# Patient Record
Sex: Female | Born: 1935 | Race: White | Hispanic: No | Smoking: Never smoker
Health system: Southern US, Community
[De-identification: ages and names within clinical notes are randomized; demographics above are authoritative.]

## PROBLEM LIST (undated history)

## (undated) DIAGNOSIS — I4891 Unspecified atrial fibrillation: Secondary | ICD-10-CM

## (undated) DIAGNOSIS — Z950 Presence of cardiac pacemaker: Secondary | ICD-10-CM

## (undated) HISTORY — PX: JOINT REPLACEMENT: SHX530

## (undated) HISTORY — PX: MITRAL VALVE REPLACEMENT: SHX147

## (undated) HISTORY — PX: OTHER SURGICAL HISTORY: SHX169

## (undated) HISTORY — DX: Presence of cardiac pacemaker: Z95.0

## (undated) HISTORY — PX: MASTECTOMY: SHX3

## (undated) HISTORY — DX: Unspecified atrial fibrillation: I48.91

---

## 2020-06-04 ENCOUNTER — Emergency Department (HOSPITAL_BASED_OUTPATIENT_CLINIC_OR_DEPARTMENT_OTHER): Payer: Medicare Other

## 2020-06-04 ENCOUNTER — Emergency Department (HOSPITAL_BASED_OUTPATIENT_CLINIC_OR_DEPARTMENT_OTHER)
Admission: EM | Admit: 2020-06-04 | Discharge: 2020-06-04 | Disposition: A | Discharge status: Home / Self Care | Payer: Medicare Other | Attending: Emergency Medicine | Admitting: Emergency Medicine

## 2020-06-04 ENCOUNTER — Encounter (HOSPITAL_BASED_OUTPATIENT_CLINIC_OR_DEPARTMENT_OTHER): Payer: Self-pay | Admitting: *Deleted

## 2020-06-04 ENCOUNTER — Other Ambulatory Visit: Payer: Self-pay

## 2020-06-04 DIAGNOSIS — W228XXA Striking against or struck by other objects, initial encounter: Secondary | ICD-10-CM | POA: Diagnosis not present

## 2020-06-04 DIAGNOSIS — M25522 Pain in left elbow: Secondary | ICD-10-CM | POA: Diagnosis not present

## 2020-06-04 DIAGNOSIS — W19XXXA Unspecified fall, initial encounter: Secondary | ICD-10-CM

## 2020-06-04 DIAGNOSIS — S0003XA Contusion of scalp, initial encounter: Secondary | ICD-10-CM | POA: Insufficient documentation

## 2020-06-04 DIAGNOSIS — M542 Cervicalgia: Secondary | ICD-10-CM | POA: Diagnosis not present

## 2020-06-04 DIAGNOSIS — Y92000 Kitchen of unspecified non-institutional (private) residence as  the place of occurrence of the external cause: Secondary | ICD-10-CM | POA: Diagnosis not present

## 2020-06-04 DIAGNOSIS — S0990XA Unspecified injury of head, initial encounter: Secondary | ICD-10-CM | POA: Diagnosis present

## 2020-06-04 HISTORY — DX: Unspecified atrial fibrillation: I48.91

## 2020-06-04 HISTORY — DX: Presence of cardiac pacemaker: Z95.0

## 2020-06-04 NOTE — ED Triage Notes (Signed)
Presents with left elbow and left shoulder pain from a fall yesterday, fell in kitchen, did not have chest pain, HA or dizziness prior to fall, states she "slipped and fell". States she takes Eliquis and did hit head above left eye, slight bruising noted. No LOC per pt statement

## 2020-06-04 NOTE — ED Notes (Signed)
ED Provider at bedside. 

## 2020-06-04 NOTE — ED Notes (Signed)
Family at bedside, sr x 2 up, call bell within reach, Fall Risk Bracelet in place

## 2020-06-04 NOTE — ED Provider Notes (Signed)
[facility] EMERGENCY DEPARTMENT Provider Note   CSN: [id number] Arrival date & time: 06/04/20  1633     History CC: Fall head injury  Stavroula Rohde is a 86 y.o. female with a history of A. fib on Eliquis presenting emergency department with a mechanical fall.  Patient reports that she lost her footing or misplaced her hand yesterday evening, and fell in the kitchen.  She reports she struck her left forehead on a counter and also think she struck her left elbow on a counter or the ground.  There is no loss of consciousness.  She had a headache after the fall but it is gradually resolved.  She reports significant pain in her left elbow just distal to her forearm.  She reports in the past she has had a fracture of this forearm near the similar site which had to be surgically repaired by an orthopedist.  She denies numbness in her hand.  She has been taken Tylenol at home for pain, as well as once a day tramadol, but has not helped with her arm pain.  She also reporting some pain in her left neck.  She denies radiculopathy.  She reports a distant history of a C2 fracture, which was not surgically repaired, but for which she wore a neck brace for weeks.   HPI     Past Medical History:  Diagnosis Date  . Atrial fibrillation (HCC)   . Pacemaker     There are no problems to display for this patient.    OB History   No obstetric history on file.     No family history on file.  Social History   Tobacco Use  . Smoking status: Never Smoker  . Smokeless tobacco: Never Used  Substance Use Topics  . Alcohol use: Not Currently  . Drug use: Never    Home Medications Prior to Admission medications   Not on File    Allergies    Patient has no allergy information on record.  Review of Systems   Review of Systems  Constitutional: Negative for chills and fever.  HENT: Negative for ear pain and sore throat.   Eyes: Negative for pain and visual disturbance.  Respiratory:  Negative for cough and shortness of breath.   Cardiovascular: Negative for chest pain and palpitations.  Gastrointestinal: Negative for abdominal pain and vomiting.  Genitourinary: Negative for dysuria and hematuria.  Musculoskeletal: Positive for arthralgias, myalgias and neck pain.  Skin: Negative for color change and rash.  Neurological: Positive for light-headedness and headaches. Negative for seizures and syncope.  Psychiatric/Behavioral: Negative for agitation and confusion.  All other systems reviewed and are negative.   Physical Exam Updated Vital Signs BP (!) 148/86 (BP Location: Right Arm)   Pulse 75   Temp 97.7 F (36.5 C) (Oral)   Resp 18   Ht 5\' 8"  (1.727 m)   Wt 68.5 kg   SpO2 100%   BMI 22.96 kg/m   Physical Exam Vitals and nursing note reviewed.  Constitutional:      General: She is not in acute distress.    Appearance: She is well-developed.  HENT:     Head: Normocephalic.     Comments: Contusion above left eye Eyes:     Conjunctiva/sclera: Conjunctivae normal.     Pupils: Pupils are equal, round, and reactive to light.  Neck:     Comments: Left paraspinal neck ttp Cardiovascular:     Rate and Rhythm: Normal rate and regular rhythm.  Heart sounds: No murmur heard.   Pulmonary:     Effort: Pulmonary effort is normal. No respiratory distress.     Breath sounds: Normal breath sounds.  Abdominal:     General: There is no distension.     Palpations: Abdomen is soft.     Tenderness: There is no abdominal tenderness.  Musculoskeletal:     Comments: LUE: Full ROM at shoulder, pain with full extension at the elbow.  TTP of the distal elbow near olecranon.  No visible deformity. Left wrist non-tender. No snuffbox tenderness. No pain with axial loading of L thumb. No superficial tenderness (skin) of L palm. No tenderness of dorsal metacarpal bones. Intact opposition, finger abduction, and wrist flexion / extension. Remainder of extremity exams normal.   No pelvic ttp or pain.   Skin:    General: Skin is warm and dry.  Neurological:     General: No focal deficit present.     Mental Status: She is alert and oriented to person, place, and time.     Sensory: No sensory deficit.     ED Results / Procedures / Treatments   Labs (all labs ordered are listed, but only abnormal results are displayed) Labs Reviewed - No data to display  EKG None  Radiology DG Elbow Complete Left  Result Date: 06/04/2020 CLINICAL DATA:  Fall, left elbow tenderness to palpation EXAM: LEFT ELBOW - COMPLETE 3+ VIEW; LEFT HUMERUS - 2+ VIEW; LEFT FOREARM - 2 VIEW COMPARISON:  None. FINDINGS: Osteopenia. No displaced acute fracture or dislocation of the left humerus, elbow, or forearm. There is pin and cerclage fixation of the left olecranon. The distal radius and ulna are intact. Soft tissues are unremarkable. IMPRESSION: 1. No displaced acute fracture or dislocation of the left humerus, elbow, or forearm. 2. Pin and cerclage fixation of the left olecranon. Electronically Signed   By: Lauralyn PrimesAlex  Bibbey M.D.   On: 06/04/2020 17:56   DG Forearm Left  Result Date: 06/04/2020 CLINICAL DATA:  Fall, left elbow tenderness to palpation EXAM: LEFT ELBOW - COMPLETE 3+ VIEW; LEFT HUMERUS - 2+ VIEW; LEFT FOREARM - 2 VIEW COMPARISON:  None. FINDINGS: Osteopenia. No displaced acute fracture or dislocation of the left humerus, elbow, or forearm. There is pin and cerclage fixation of the left olecranon. The distal radius and ulna are intact. Soft tissues are unremarkable. IMPRESSION: 1. No displaced acute fracture or dislocation of the left humerus, elbow, or forearm. 2. Pin and cerclage fixation of the left olecranon. Electronically Signed   By: Lauralyn PrimesAlex  Bibbey M.D.   On: 06/04/2020 17:56   CT Head Wo Contrast  Result Date: 06/04/2020 CLINICAL DATA:  Trauma/fall, on Eliquis, bruising to left eye. Chronic C2 fracture, by report. EXAM: CT HEAD WITHOUT CONTRAST CT CERVICAL SPINE WITHOUT  CONTRAST TECHNIQUE: Multidetector CT imaging of the head and cervical spine was performed following the standard protocol without intravenous contrast. Multiplanar CT image reconstructions of the cervical spine were also generated. COMPARISON:  CT head dated 03/15/2020 FINDINGS: CT HEAD FINDINGS Brain: No evidence of acute infarction, hemorrhage, hydrocephalus, extra-axial collection or mass lesion/mass effect. Global cortical atrophy. Subcortical white matter and periventricular small vessel ischemic changes. Vascular: Intracranial atherosclerosis. Skull: Normal. Negative for fracture or focal lesion. Sinuses/Orbits: The visualized paranasal sinuses are essentially clear. The mastoid air cells are unopacified. Other: None. CT CERVICAL SPINE FINDINGS Alignment: Normal cervical lordosis. Skull base and vertebrae: No acute fracture. Suspected residual deformity related to a healed type 2/3 dens fracture (sagittal image  31). No primary bone lesion or focal pathologic process. Soft tissues and spinal canal: No prevertebral fluid or swelling. No visible canal hematoma. Disc levels: Very mild degenerative changes at C5-6. Spinal canal is patent. Upper chest: Visualized lung apices are notable for mild biapical pleural-parenchymal scarring. Other: Visualized thyroid is unremarkable. IMPRESSION: No evidence of acute intracranial abnormality. Atrophy with small vessel ischemic changes. No evidence of acute traumatic injury to the cervical spine. Suspected residual deformity related to a healed type 2/3 dens fracture. Electronically Signed   By: Charline Bills M.D.   On: 06/04/2020 17:34   CT Cervical Spine Wo Contrast  Result Date: 06/04/2020 CLINICAL DATA:  Trauma/fall, on Eliquis, bruising to left eye. Chronic C2 fracture, by report. EXAM: CT HEAD WITHOUT CONTRAST CT CERVICAL SPINE WITHOUT CONTRAST TECHNIQUE: Multidetector CT imaging of the head and cervical spine was performed following the standard protocol  without intravenous contrast. Multiplanar CT image reconstructions of the cervical spine were also generated. COMPARISON:  CT head dated 03/15/2020 FINDINGS: CT HEAD FINDINGS Brain: No evidence of acute infarction, hemorrhage, hydrocephalus, extra-axial collection or mass lesion/mass effect. Global cortical atrophy. Subcortical white matter and periventricular small vessel ischemic changes. Vascular: Intracranial atherosclerosis. Skull: Normal. Negative for fracture or focal lesion. Sinuses/Orbits: The visualized paranasal sinuses are essentially clear. The mastoid air cells are unopacified. Other: None. CT CERVICAL SPINE FINDINGS Alignment: Normal cervical lordosis. Skull base and vertebrae: No acute fracture. Suspected residual deformity related to a healed type 2/3 dens fracture (sagittal image 31). No primary bone lesion or focal pathologic process. Soft tissues and spinal canal: No prevertebral fluid or swelling. No visible canal hematoma. Disc levels: Very mild degenerative changes at C5-6. Spinal canal is patent. Upper chest: Visualized lung apices are notable for mild biapical pleural-parenchymal scarring. Other: Visualized thyroid is unremarkable. IMPRESSION: No evidence of acute intracranial abnormality. Atrophy with small vessel ischemic changes. No evidence of acute traumatic injury to the cervical spine. Suspected residual deformity related to a healed type 2/3 dens fracture. Electronically Signed   By: Charline Bills M.D.   On: 06/04/2020 17:34   DG Humerus Left  Result Date: 06/04/2020 CLINICAL DATA:  Fall, left elbow tenderness to palpation EXAM: LEFT ELBOW - COMPLETE 3+ VIEW; LEFT HUMERUS - 2+ VIEW; LEFT FOREARM - 2 VIEW COMPARISON:  None. FINDINGS: Osteopenia. No displaced acute fracture or dislocation of the left humerus, elbow, or forearm. There is pin and cerclage fixation of the left olecranon. The distal radius and ulna are intact. Soft tissues are unremarkable. IMPRESSION: 1. No  displaced acute fracture or dislocation of the left humerus, elbow, or forearm. 2. Pin and cerclage fixation of the left olecranon. Electronically Signed   By: Lauralyn Primes M.D.   On: 06/04/2020 17:56    Procedures Procedures (including critical care time)  Medications Ordered in ED Medications - No data to display  ED Course  I have reviewed the triage vital signs and the nursing notes.  Pertinent labs & imaging results that were available during my care of the patient were reviewed by me and considered in my medical decision making (see chart for details).  This is a 86 year old female on Eliquis presented emergency department with a mechanical fall yesterday, here with a contusion to her head as well as left elbow pain.  Plan for CT scan of the head and the C-spine.  We will also get x-rays of the left forearm, left elbow, left humerus.  She is neurovascularly intact.  She has no other  evidence of injuries anywhere else on her exam.  She is here with her family member.  Clinical Course as of Jun 06 39  Sat Jun 04, 2020  1746  IMPRESSION: No evidence of acute intracranial abnormality. Atrophy with small vessel ischemic changes.  No evidence of acute traumatic injury to the cervical spine. Suspected residual deformity related to a healed type 2/3 dens fracture.    [MT]  1804 IMPRESSION: 1. No displaced acute fracture or dislocation of the left humerus, elbow, or forearm. 2. Pin and cerclage fixation of the left olecranon   [MT]  1811 Reviewed the patient's findings with her.  No acute fracture seen.  I recommend that she follow-up with an orthopedist.  She no longer sees her elbow specialist who is under her arm, but will rather see someone locally.  I will provide the on-call providers number here.  I did offer her a sling for her comfort, but she and her daughter refuses, stating that she needs to use both arms to ambulate with a stroller.  I do think she is safe to do this.   Okay for discharge.   [MT]    Clinical Course User Index [MT] Renaye Rakers Kermit Balo, MD    Final Clinical Impression(s) / ED Diagnoses Final diagnoses:  Left elbow pain  Fall, initial encounter  Contusion of scalp, initial encounter    Rx / DC Orders ED Discharge Orders    None       Teagan Ozawa, Kermit Balo, MD 06/05/20 0040

## 2020-06-06 ENCOUNTER — Telehealth: Payer: Self-pay | Admitting: Family Medicine

## 2020-06-06 NOTE — Telephone Encounter (Signed)
ED follow appt declined --Patient states will have PCP advice before scheduling w/ [place].  -- FYI.  --glh

## 2020-06-13 ENCOUNTER — Telehealth: Payer: Self-pay | Admitting: *Deleted

## 2020-06-13 ENCOUNTER — Ambulatory Visit (INDEPENDENT_AMBULATORY_CARE_PROVIDER_SITE_OTHER): Payer: Medicare Other | Admitting: Family Medicine

## 2020-06-13 ENCOUNTER — Ambulatory Visit: Payer: Self-pay

## 2020-06-13 ENCOUNTER — Encounter: Payer: Self-pay | Admitting: Family Medicine

## 2020-06-13 ENCOUNTER — Other Ambulatory Visit: Payer: Self-pay

## 2020-06-13 VITALS — BP 124/71 | HR 70 | Ht 69.0 in | Wt 151.0 lb

## 2020-06-13 DIAGNOSIS — M25422 Effusion, left elbow: Secondary | ICD-10-CM

## 2020-06-13 DIAGNOSIS — M25522 Pain in left elbow: Secondary | ICD-10-CM

## 2020-06-13 MED ORDER — HYDROCODONE-ACETAMINOPHEN 5-325 MG PO TABS: 1.0000 | ORAL_TABLET | Freq: Three times a day (TID) | ORAL | 0 refills | Status: AC | PRN

## 2020-06-13 NOTE — Telephone Encounter (Signed)
Pt called regarding name of orthopedic MD she was referred to on her last ED visit.  RNCM reviewed chart and provided Dr Jordan Likes number as printed on AVS.

## 2020-06-13 NOTE — Progress Notes (Signed)
Rebecca Kerr - 86 y.o. female MRN [id number]  Date of birth: 03-Aug-1925  SUBJECTIVE:  Including CC & ROS.  Chief Complaint  Patient presents with  . Elbow Injury    left x 10 days    Rebecca Kerr is a 86 y.o. female that is presenting with left elbow pain.  She had a fall about 10 days ago.  Since that time she said severe left elbow pain.  She has had bruising around the elbow.  She has severe pain with any movement.  She cannot extend her arm.  Denies any numbness or tingling.  Has a history of surgery of the elbow..  Independent review of the left elbow x-ray from 10/2 shows a positive sail sign.   Review of Systems See HPI   HISTORY: Past Medical, Surgical, Social, and Family History Reviewed & Updated per EMR.   Pertinent Historical Findings include:  Past Medical History:  Diagnosis Date  . Atrial fibrillation (HCC)   . Pacemaker     Past Surgical History:  Procedure Laterality Date  . diverticulitis    . JOINT REPLACEMENT    . MASTECTOMY    . MITRAL VALVE REPLACEMENT      History reviewed. No pertinent family history.  Social History   Socioeconomic History  . Marital status: Divorced    Spouse name: Not on file  . Number of children: Not on file  . Years of education: Not on file  . Highest education level: Not on file  Occupational History  . Not on file  Tobacco Use  . Smoking status: Never Smoker  . Smokeless tobacco: Never Used  Substance and Sexual Activity  . Alcohol use: Not Currently  . Drug use: Never  . Sexual activity: Not Currently  Other Topics Concern  . Not on file  Social History Narrative  . Not on file   Social Determinants of Health   Financial Resource Strain:   . Difficulty of Paying Living Expenses: Not on file  Food Insecurity:   . Worried About Programme researcher, broadcasting/film/video in the Last Year: Not on file  . Ran Out of Food in the Last Year: Not on file  Transportation Needs:   . Lack of Transportation (Medical): Not on file  .  Lack of Transportation (Non-Medical): Not on file  Physical Activity:   . Days of Exercise per Week: Not on file  . Minutes of Exercise per Session: Not on file  Stress:   . Feeling of Stress : Not on file  Social Connections:   . Frequency of Communication with Friends and Family: Not on file  . Frequency of Social Gatherings with Friends and Family: Not on file  . Attends Religious Services: Not on file  . Active Member of Clubs or Organizations: Not on file  . Attends Banker Meetings: Not on file  . Marital Status: Not on file  Intimate Partner Violence:   . Fear of Current or Ex-Partner: Not on file  . Emotionally Abused: Not on file  . Physically Abused: Not on file  . Sexually Abused: Not on file     PHYSICAL EXAM:  VS: BP 124/71   Pulse 70   Ht 5\' 9"  (1.753 m)   Wt 151 lb (68.5 kg)   BMI 22.30 kg/m  Physical Exam Gen: NAD, alert, cooperative with exam, well-appearing MSK:  Left elbow: Limited flexion and extension. Ecchymosis over the distal radius. Able to supinate and pronate. Normal grip strength. Pain with  palpation around the proximal radius.  Limited ultrasound: Left elbow:  Moderate effusion within the elbow joint. No changes at the lateral epicondyle. No changes of the olecranon.   Summary: Elbow effusion.  Ultrasound and interpretation by Clare Gandy, MD  1. Elbow,arm 2. Left 3. Posterior long arm splint 4. Ortho-Glass 5. Applied by Dr. Jordan Likes   ASSESSMENT & PLAN:   Elbow effusion, left She had a fall on 10/2.  Has had ongoing pain and lack of range of motion.  Imaging suggest occult fracture with positive sail sign.  Moderate effusion on ultrasound today. -Counseled supportive care. -Placed splint and sling. -Norco. -Follow-up in 1 week.

## 2020-06-13 NOTE — Patient Instructions (Signed)
Nice to meet you Please try ice  Please use the noco as needed. Do not take with tramadol   Please send me a message in MyChart with any questions or updates.  Please see me back in 1 week.   --Dr. Jordan Likes

## 2020-06-14 ENCOUNTER — Encounter: Payer: Self-pay | Admitting: Family Medicine

## 2020-06-14 DIAGNOSIS — M25422 Effusion, left elbow: Secondary | ICD-10-CM | POA: Insufficient documentation

## 2020-06-14 NOTE — Assessment & Plan Note (Signed)
She had a fall on 10/2.  Has had ongoing pain and lack of range of motion.  Imaging suggest occult fracture with positive sail sign.  Moderate effusion on ultrasound today. -Counseled supportive care. -Placed splint and sling. -Norco. -Follow-up in 1 week.

## 2020-06-20 ENCOUNTER — Ambulatory Visit: Payer: Medicare Other | Admitting: Family Medicine

## 2020-06-20 NOTE — Progress Notes (Deleted)
  Connelly Spruell - 86 y.o. female MRN [id number]  Date of birth: [birth year]  SUBJECTIVE:  Including CC & ROS.  No chief complaint on file.   Khaniyah Bezek is a 86 y.o. female that is  ***.  ***   Review of Systems See HPI   HISTORY: Past Medical, Surgical, Social, and Family History Reviewed & Updated per EMR.   Pertinent Historical Findings include:  Past Medical History:  Diagnosis Date  . Atrial fibrillation (HCC)   . Pacemaker     Past Surgical History:  Procedure Laterality Date  . diverticulitis    . JOINT REPLACEMENT    . MASTECTOMY    . MITRAL VALVE REPLACEMENT      No family history on file.  Social History   Socioeconomic History  . Marital status: Divorced    Spouse name: Not on file  . Number of children: Not on file  . Years of education: Not on file  . Highest education level: Not on file  Occupational History  . Not on file  Tobacco Use  . Smoking status: Never Smoker  . Smokeless tobacco: Never Used  Substance and Sexual Activity  . Alcohol use: Not Currently  . Drug use: Never  . Sexual activity: Not Currently  Other Topics Concern  . Not on file  Social History Narrative  . Not on file   Social Determinants of Health   Financial Resource Strain:   . Difficulty of Paying Living Expenses: Not on file  Food Insecurity:   . Worried About Programme researcher, broadcasting/film/video in the Last Year: Not on file  . Ran Out of Food in the Last Year: Not on file  Transportation Needs:   . Lack of Transportation (Medical): Not on file  . Lack of Transportation (Non-Medical): Not on file  Physical Activity:   . Days of Exercise per Week: Not on file  . Minutes of Exercise per Session: Not on file  Stress:   . Feeling of Stress : Not on file  Social Connections:   . Frequency of Communication with Friends and Family: Not on file  . Frequency of Social Gatherings with Friends and Family: Not on file  . Attends Religious Services: Not on file  . Active Member of  Clubs or Organizations: Not on file  . Attends Banker Meetings: Not on file  . Marital Status: Not on file  Intimate Partner Violence:   . Fear of Current or Ex-Partner: Not on file  . Emotionally Abused: Not on file  . Physically Abused: Not on file  . Sexually Abused: Not on file     PHYSICAL EXAM:  VS: There were no vitals taken for this visit. Physical Exam Gen: NAD, alert, cooperative with exam, well-appearing MSK:  ***      ASSESSMENT & PLAN:   No problem-specific Assessment & Plan notes found for this encounter.

## 2021-06-03 IMAGING — DX DG FOREARM 2V*L*
2 series · 2 of 2 positions shown · non-contrast
Comparison: None.

CLINICAL DATA: Fall, left elbow tenderness to palpation

EXAM:
LEFT ELBOW - COMPLETE 3+ VIEW; LEFT HUMERUS - 2+ VIEW; LEFT FOREARM
- 2 VIEW

[forearm ap]
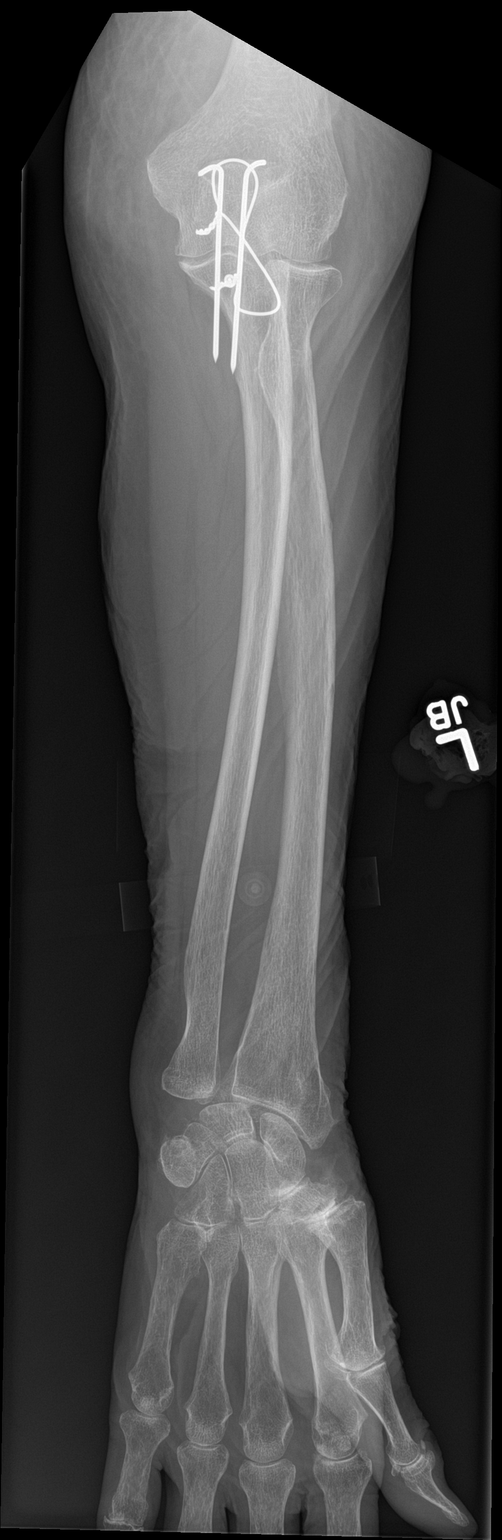

[forearm lat]
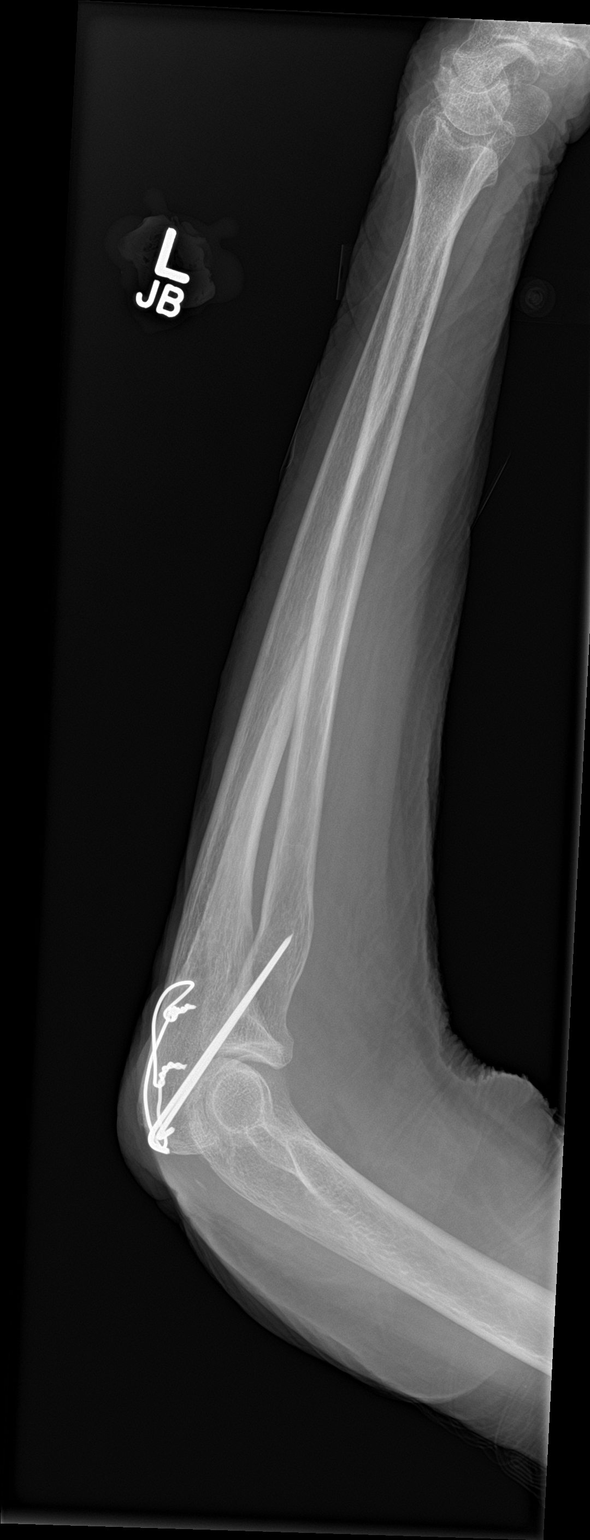

[2 of 2 positions shown; findings below may reference images not displayed]

FINDINGS: Osteopenia. No displaced acute fracture or dislocation of the left
humerus, elbow, or forearm. There is pin and cerclage fixation of
the left olecranon. The distal radius and ulna are intact. Soft
tissues are unremarkable.
IMPRESSION: 1. No displaced acute fracture or dislocation of the left humerus,
elbow, or forearm.
2. Pin and cerclage fixation of the left olecranon.

## 2021-06-03 IMAGING — CT CT CERVICAL SPINE W/O CM
2 series · 12 of 27 positions shown, 15 images · non-contrast
Comparison: CT head dated 03/15/2020

CLINICAL DATA: Trauma/fall, on Eliquis, bruising to left eye.
Chronic C2 fracture, by report.

EXAM:
CT HEAD WITHOUT CONTRAST
CT CERVICAL SPINE WITHOUT CONTRAST
TECHNIQUE: Multidetector CT imaging of the head and cervical spine was
performed following the standard protocol without intravenous
contrast. Multiplanar CT image reconstructions of the cervical spine
were also generated.

[Series 3: c_spine 2.0 i30s 3 · axial · 0.40mm/px · z∈[-308,-176]mm · 7 of 78 slices shown, 9 images]
[im 6/78  soft-tissue]
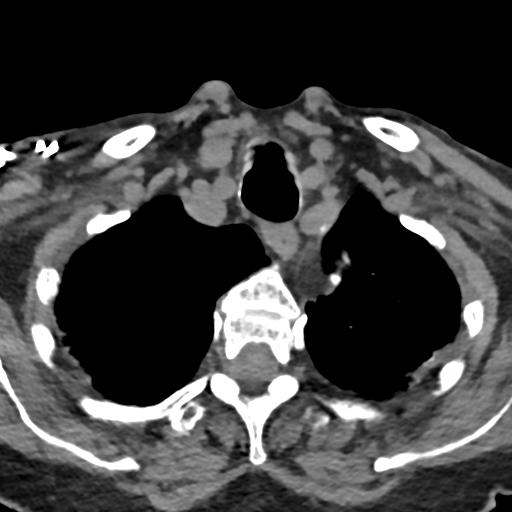
[im 6/78  bone]
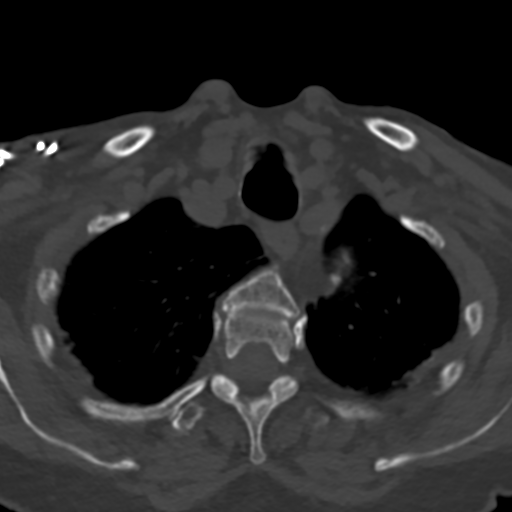
[im 18/78  bone]
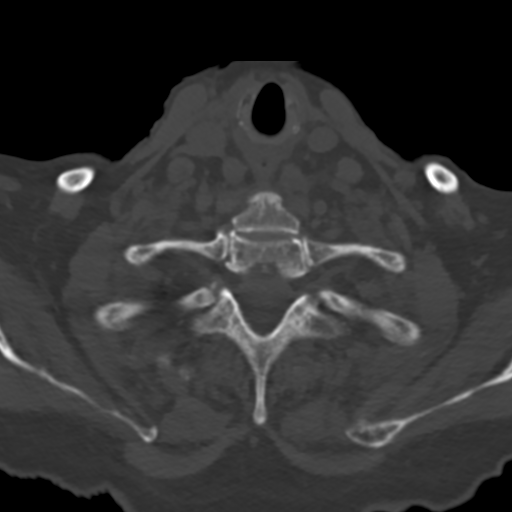
[im 30/78  bone]
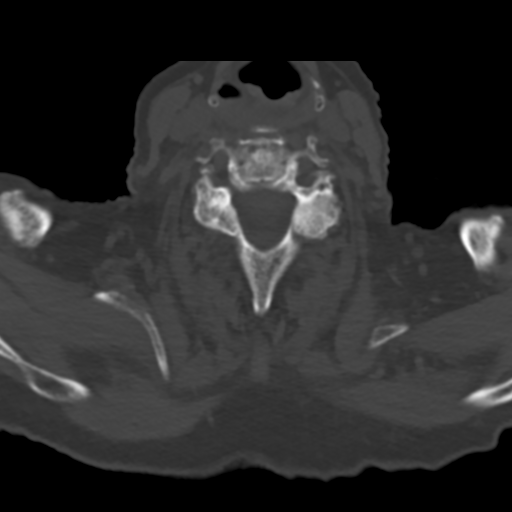
[im 42/78  bone]
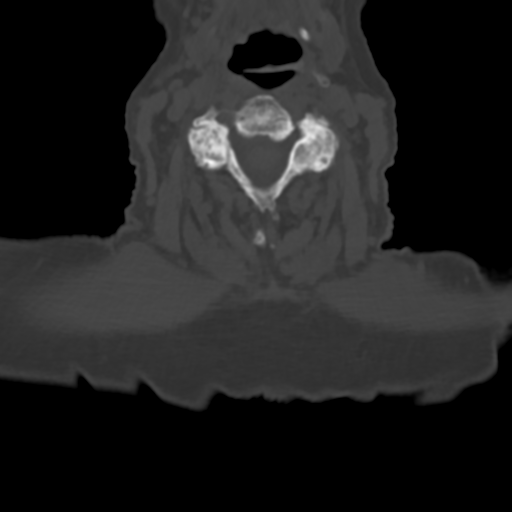
[im 48/78  soft-tissue]
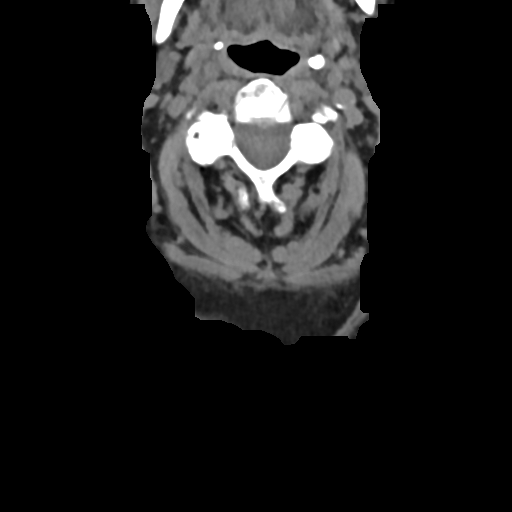
[im 48/78  bone]
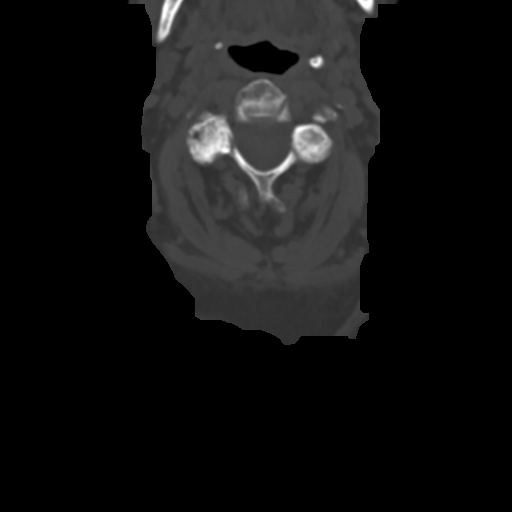
[im 60/78  bone]
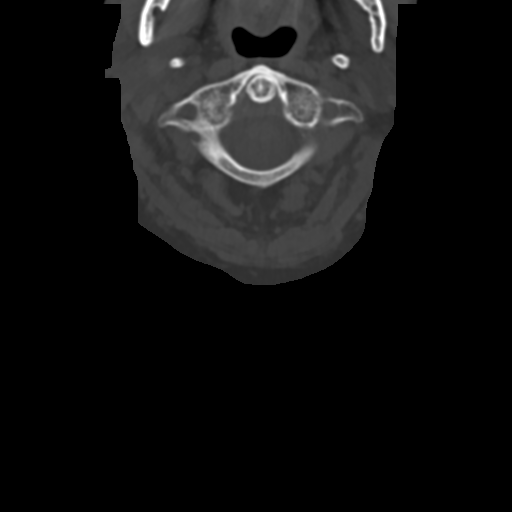
[im 72/78  bone]
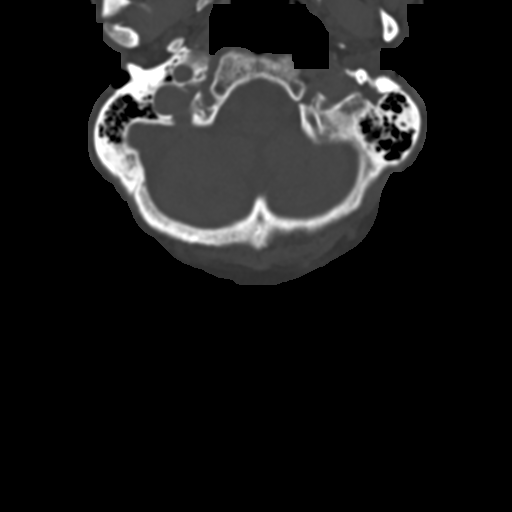

[Series 6: sagittals · sagittal · 0.28mm/px · 5 of 63 slices shown, 6 images]
[im 21/63  bone]
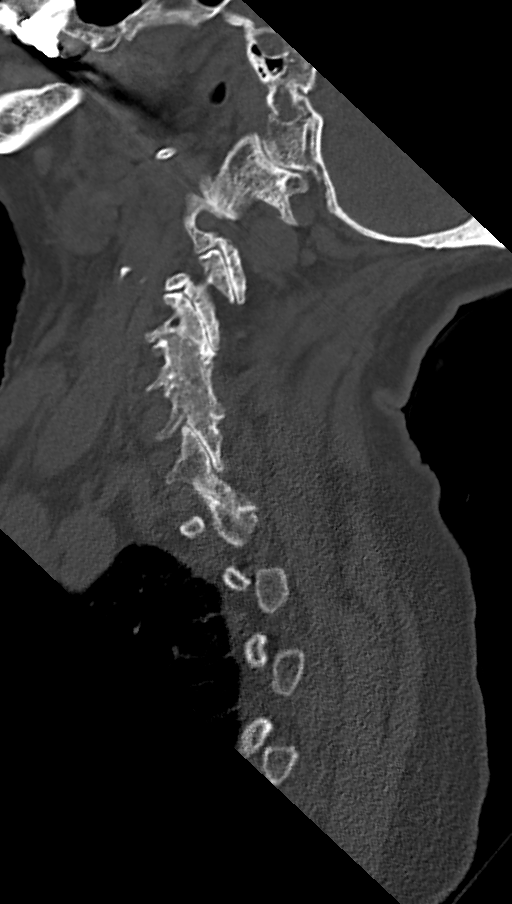
[im 26/63  bone]
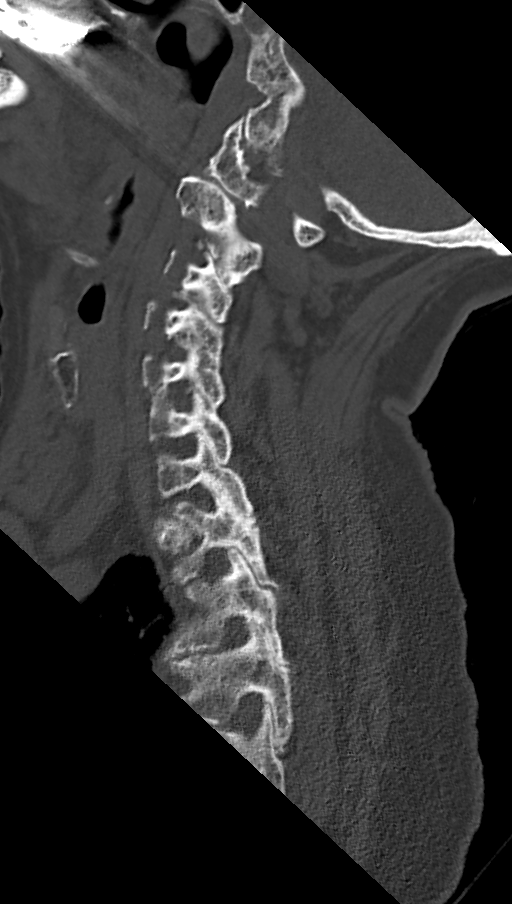
[im 32/63  soft-tissue]
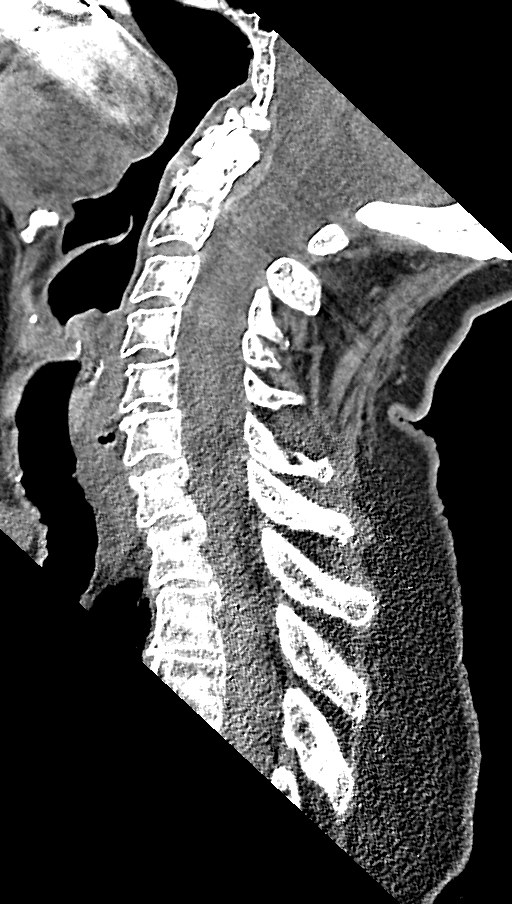
[im 32/63  bone]
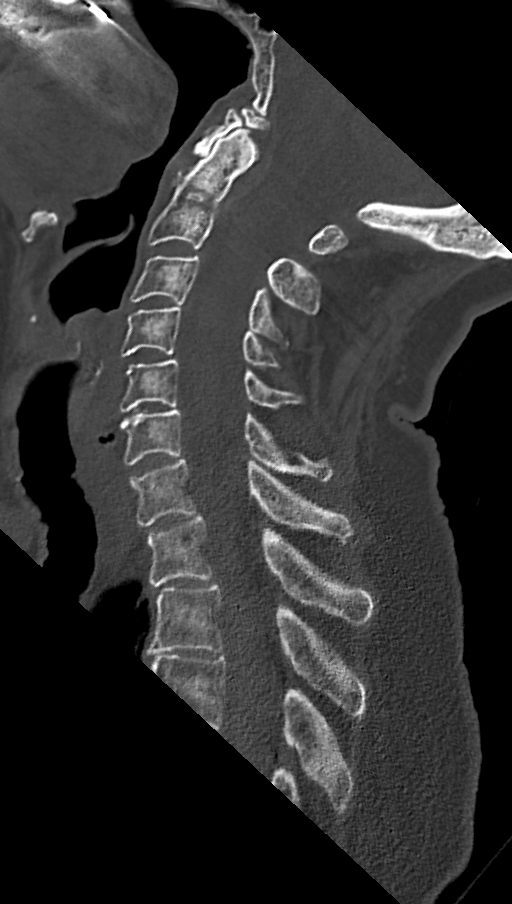
[im 37/63  bone]
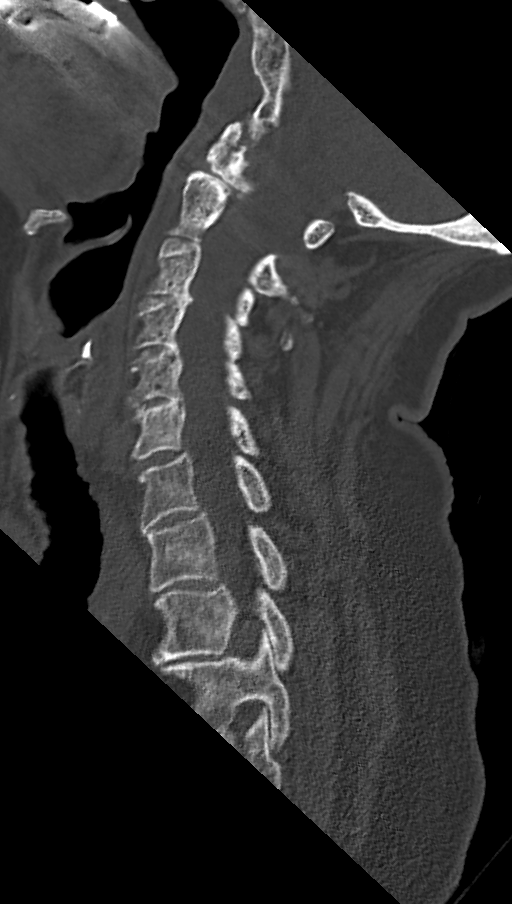
[im 42/63  bone]
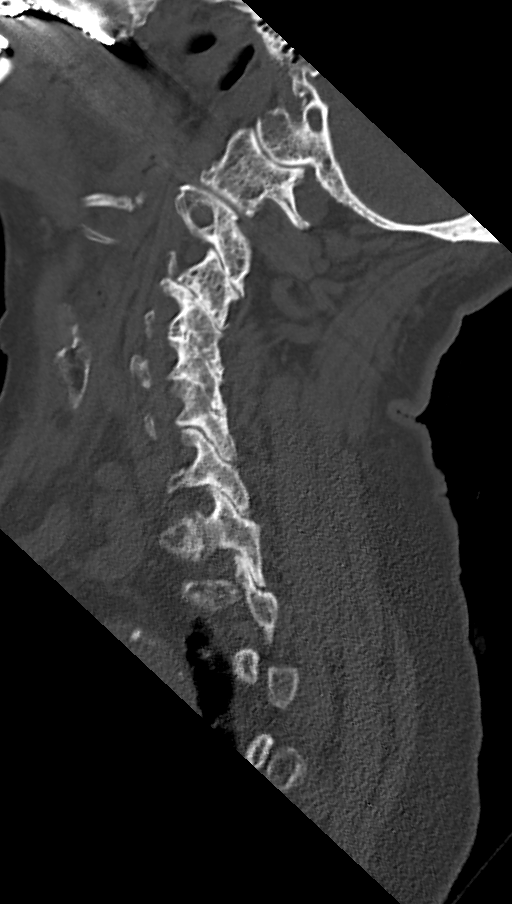

[12 of 27 positions shown; findings below may reference images not displayed]

FINDINGS: CT HEAD FINDINGS

Brain: No evidence of acute infarction, hemorrhage, hydrocephalus,
extra-axial collection or mass lesion/mass effect.

Global cortical atrophy.

Subcortical white matter and periventricular small vessel ischemic
changes.

Vascular: Intracranial atherosclerosis.

Skull: Normal. Negative for fracture or focal lesion.

Sinuses/Orbits: The visualized paranasal sinuses are essentially
clear. The mastoid air cells are unopacified.

Other: None.

CT CERVICAL SPINE FINDINGS

Alignment: Normal cervical lordosis.

Skull base and vertebrae: No acute fracture. Suspected residual
deformity related to a healed type [DATE] dens fracture (sagittal image
31). No primary bone lesion or focal pathologic process.

Soft tissues and spinal canal: No prevertebral fluid or swelling. No
visible canal hematoma.

Disc levels: Very mild degenerative changes at C5-6. Spinal canal is
patent.

Upper chest: Visualized lung apices are notable for mild biapical
pleural-parenchymal scarring.

Other: Visualized thyroid is unremarkable.
IMPRESSION: No evidence of acute intracranial abnormality. Atrophy with small
vessel ischemic changes.

No evidence of acute traumatic injury to the cervical spine.
Suspected residual deformity related to a healed type [DATE] dens
fracture.

## 2022-12-20 ENCOUNTER — Encounter: Payer: Self-pay | Admitting: *Deleted
